# Patient Record
Sex: Female | Born: 2000 | Race: White | Hispanic: Yes | Smoking: Never smoker
Health system: Southern US, Community
[De-identification: ages and names within clinical notes are randomized; demographics above are authoritative.]

## PROBLEM LIST (undated history)

## (undated) HISTORY — PX: APPENDECTOMY: SHX54

---
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
## 2019-05-23 ENCOUNTER — Emergency Department (HOSPITAL_COMMUNITY)
Admission: EM | Admit: 2019-05-23 | Discharge: 2019-05-23 | Disposition: A | Discharge status: Home / Self Care | Payer: Self-pay | Attending: Emergency Medicine | Admitting: Emergency Medicine

## 2019-05-23 ENCOUNTER — Encounter (HOSPITAL_COMMUNITY): Payer: Self-pay | Admitting: Emergency Medicine

## 2019-05-23 ENCOUNTER — Other Ambulatory Visit: Payer: Self-pay

## 2019-05-23 DIAGNOSIS — Z20822 Contact with and (suspected) exposure to covid-19: Secondary | ICD-10-CM

## 2019-05-23 DIAGNOSIS — J029 Acute pharyngitis, unspecified: Secondary | ICD-10-CM | POA: Insufficient documentation

## 2019-05-23 DIAGNOSIS — Z20828 Contact with and (suspected) exposure to other viral communicable diseases: Secondary | ICD-10-CM | POA: Insufficient documentation

## 2019-05-23 DIAGNOSIS — B349 Viral infection, unspecified: Secondary | ICD-10-CM | POA: Insufficient documentation

## 2019-05-23 LAB — POC URINE PREG, ED: Preg Test, Ur: NEGATIVE

## 2019-05-23 LAB — GROUP A STREP BY PCR: Group A Strep by PCR: NOT DETECTED

## 2019-05-23 MED ORDER — ACETAMINOPHEN 500 MG PO TABS: 500.0000 mg | ORAL_TABLET | Freq: Four times a day (QID) | ORAL | 0 refills | Status: AC | PRN

## 2019-05-23 MED ORDER — LIDOCAINE VISCOUS HCL 2 % MT SOLN: 15.0000 mL | OROMUCOSAL | 0 refills | Status: AC | PRN

## 2019-05-23 MED ORDER — IBUPROFEN 800 MG PO TABS
800.0000 mg | ORAL_TABLET | Freq: Once | ORAL | Status: AC
Start: 2019-05-23 — End: 2019-05-23
  Administered 2019-05-23: 800 mg via ORAL
  Filled 2019-05-23: qty 1

## 2019-05-23 NOTE — ED Notes (Signed)
Patient verbalizes understanding of discharge instructions. Opportunity for questioning and answers were provided. Armband removed by staff, pt discharged from ED.  

## 2019-05-23 NOTE — ED Provider Notes (Signed)
[facility] EMERGENCY DEPARTMENT Provider Note   CSN: [id number] Arrival date & time: 05/23/19  1513     History   Chief Complaint Chief Complaint  Patient presents with  . Fever  . Sore Throat    HPI Debbie Glass is a 18 y.o. female.     The history is provided by the patient. No language interpreter was used.  Fever Sore Throat     18 year old female presenting complaining of sore throat.  Patient reports since last night she has had persistent sore throat.  Increasing pain with swallowing.  She also endorsed subjective fever, chills, symptom is moderate in severity.  She does not report any significant headache, ear pain, congestion, sneezing, coughing, chest pain or shortness of breath.  She is unsure if she is pregnant.  She did take some ibuprofen earlier today.  She works with a Scientist, research (physical sciences) at [employer].  She denies any recent sick contact or any contact with anyone with COVID-19.  No recent travel.  No past medical history on file.  Patient Active Problem List   Diagnosis Date Noted  . Appendicitis with peritonitis 08/27/2012  . Ileus, postoperative ([place]) 08/27/2012    Past Surgical History:  Procedure Laterality Date  . APPENDECTOMY    . LAPAROSCOPIC APPENDECTOMY  08/26/2012   Procedure: APPENDECTOMY LAPAROSCOPIC;  Surgeon: Jerilynn Mages. Gerald Stabs, MD;  Location: [facility];  Service: Pediatrics;  Laterality: N/A;     OB History   No obstetric history on file.      Home Medications    Prior to Admission medications   Medication Sig Start Date End Date Taking? Authorizing Provider  dextrose 5 % SOLN 50 mL with cefTRIAXone 1 G SOLR 2,000 mg Inject 2,000 mg into the vein daily. 09/01/12   Hess, Tamela Oddi, DO  ibuprofen (ADVIL,MOTRIN) 200 MG tablet Take 200 mg by mouth every 6 (six) hours as needed. For stomach pain.    [provider]  nystatin (MYCOSTATIN) 100000 UNIT/ML suspension Take 5 mLs (500,000 Units total) by mouth 4 (four) times  daily. 09/02/12   Isaac Bliss, MD  oxyCODONE (OXY IR/ROXICODONE) 5 MG immediate release tablet Take 5 mg by mouth every 4 (four) hours as needed. FOR PAIN 09/01/12   Nolon Rod, DO    Family History Family History  Problem Relation Age of Onset  . Hypertension Paternal Grandmother     Social History Social History   Tobacco Use  . Smoking status: Not on file  . Smokeless tobacco: Never Used  Substance Use Topics  . Alcohol use: No  . Drug use: No     Allergies   Patient has no known allergies.   Review of Systems Review of Systems  Constitutional: Positive for fever.  All other systems reviewed and are negative.    Physical Exam Updated Vital Signs BP 123/82   Pulse (!) 107   Temp 100.2 F (37.9 C) (Oral)   Resp 20   Ht 5' (1.524 m)   Wt 68 kg   SpO2 97%   BMI 29.29 kg/m   Physical Exam Vitals signs and nursing note reviewed.  Constitutional:      General: She is not in acute distress.    Appearance: She is well-developed.  HENT:     Head: Atraumatic.     Comments: Ears: TMs normal bilateral Nose: Normal nares Throat: Uvula midline bilateral tonsillar enlargement without exudates, no trismus    Mouth/Throat:     Mouth: Mucous membranes are  moist.  Eyes:     Conjunctiva/sclera: Conjunctivae normal.  Neck:     Musculoskeletal: Neck supple. No neck rigidity.  Cardiovascular:     Rate and Rhythm: Tachycardia present.     Pulses: Normal pulses.     Heart sounds: Normal heart sounds.  Pulmonary:     Effort: Pulmonary effort is normal.     Breath sounds: Normal breath sounds.  Abdominal:     General: Abdomen is flat.     Palpations: Abdomen is soft.     Tenderness: There is no abdominal tenderness.  Skin:    Findings: No rash.  Neurological:     Mental Status: She is alert and oriented to person, place, and time.      ED Treatments / Results  Labs (all labs ordered are listed, but only abnormal results are displayed) Labs Reviewed   GROUP A STREP BY PCR  NOVEL CORONAVIRUS, NAA (HOSPITAL ORDER, SEND-OUT TO REF LAB)  POC URINE PREG, ED    EKG    Radiology No results found.  Procedures Procedures (including critical care time)  Medications Ordered in ED Medications  ibuprofen (ADVIL) tablet 800 mg (800 mg Oral Given 05/23/19 1611)     Initial Impression / Assessment and Plan / ED Course  I have reviewed the triage vital signs and the nursing notes.  Pertinent labs & imaging results that were available during my care of the patient were reviewed by me and considered in my medical decision making (see chart for details).        BP 123/82   Pulse (!) 107   Temp 100.2 F (37.9 C) (Oral)   Resp 20   Ht 5' (1.524 m)   Wt 68 kg   SpO2 97%   BMI 29.29 kg/m    Final Clinical Impressions(s) / ED Diagnoses   Final diagnoses:  Viral pharyngitis  Suspected Covid-19 Virus Infection    ED Discharge Orders         Ordered    lidocaine (XYLOCAINE) 2 % solution  Every 4 hours PRN     05/23/19 1830    acetaminophen (TYLENOL) 500 MG tablet  Every 6 hours PRN     05/23/19 1830         3:36 PM Patient here with fever and sore throat.  Oral temp is 100.2.  She found to be tachycardic with a heart rate of 131, EKG shows evidence of sinus tach without concerning arrhythmia.  She does have tonsillar enlargement without exudates suspect pharyngitis causing his symptom.  In the setting of COVID-19, we will also obtain COVID-19 testing as well.  Patient otherwise well-appearing in no acute respiratory discomfort and no trouble tolerating her secretion.  6:32 PM Tachycardia improved with fluid and antipyretic.  strep test negative.  Pregnancy test is negative.  Patient is well-appearing.  She is able to tolerate p.o.  Will discharge home with symptomatic treatment.  Recommend social isolation and self quarantine until her test result comes back.  Return precaution discussed.  Debbie Glass was evaluated in  Emergency Department on 05/23/2019 for the symptoms described in the history of present illness. She was evaluated in the context of the global COVID-19 pandemic, which necessitated consideration that the patient might be at risk for infection with the SARS-CoV-2 virus that causes COVID-19. Institutional protocols and algorithms that pertain to the evaluation of patients at risk for COVID-19 are in a state of rapid change based on information released by regulatory bodies including the  CDC and federal and [employer] organizations. These policies and algorithms were followed during the patient's care in the ED.    Fayrene Helperran, Shakiyla Kook, PA-C 05/23/19 1833    Arby BarrettePfeiffer, Marcy, MD 05/25/19 [phone]

## 2019-05-23 NOTE — ED Triage Notes (Signed)
Patient c/o sore throat and fever onset of yesterday. Patient reports pain when swallowing. Denies any shortness of breath. Took ibuprofen at 3am last night.

## 2019-05-26 LAB — NOVEL CORONAVIRUS, NAA (HOSP ORDER, SEND-OUT TO REF LAB; TAT 18-24 HRS): SARS-CoV-2, NAA: NOT DETECTED

## 2021-02-07 ENCOUNTER — Other Ambulatory Visit: Payer: Self-pay

## 2021-02-07 ENCOUNTER — Ambulatory Visit (INDEPENDENT_AMBULATORY_CARE_PROVIDER_SITE_OTHER): Payer: 59 | Admitting: Registered Nurse

## 2021-02-07 ENCOUNTER — Encounter: Payer: Self-pay | Admitting: Registered Nurse

## 2021-02-07 VITALS — BP 123/85 | HR 82 | Temp 98.0°F | Resp 18 | Ht 60.0 in | Wt 165.0 lb

## 2021-02-07 DIAGNOSIS — Z0001 Encounter for general adult medical examination with abnormal findings: Secondary | ICD-10-CM | POA: Diagnosis not present

## 2021-02-07 DIAGNOSIS — Z7689 Persons encountering health services in other specified circumstances: Secondary | ICD-10-CM

## 2021-02-07 DIAGNOSIS — Z0000 Encounter for general adult medical examination without abnormal findings: Secondary | ICD-10-CM

## 2021-02-07 DIAGNOSIS — N939 Abnormal uterine and vaginal bleeding, unspecified: Secondary | ICD-10-CM

## 2021-02-07 DIAGNOSIS — Z3009 Encounter for other general counseling and advice on contraception: Secondary | ICD-10-CM

## 2021-02-07 LAB — CBC WITH DIFFERENTIAL
Basophils Absolute: 0 10*3/uL (ref 0.0–0.2)
Lymphocytes Absolute: 2.6 10*3/uL (ref 0.7–3.1)
MCHC: 34.7 g/dL (ref 31.5–35.7)
Monocytes: 6 %
Neutrophils Absolute: 4.5 10*3/uL (ref 1.4–7.0)
RDW: 12.4 % (ref 11.7–15.4)

## 2021-02-07 LAB — BASIC METABOLIC PANEL

## 2021-02-07 LAB — POCT URINE PREGNANCY: Preg Test, Ur: NEGATIVE

## 2021-02-07 LAB — HIV ANTIBODY (ROUTINE TESTING W REFLEX)

## 2021-02-07 LAB — THYROID PANEL WITH TSH

## 2021-02-07 LAB — RPR

## 2021-02-07 NOTE — Patient Instructions (Signed)
° ° ° °  If you have lab work done today you will be contacted with your lab results within the next 2 weeks.  If you have not heard from us then please contact us. The fastest way to get your results is to register for My Chart. ° ° °IF you received an x-ray today, you will receive an invoice from [facility]. Please contact [facility] at [phone] with questions or concerns regarding your invoice.  ° °IF you received labwork today, you will receive an invoice from [facility]. Please contact [facility] at [phone] with questions or concerns regarding your invoice.  ° °Our billing staff will not be able to assist you with questions regarding bills from these companies. ° °You will be contacted with the lab results as soon as they are available. The fastest way to get your results is to activate your My Chart account. Instructions are located on the last page of this paperwork. If you have not heard from us regarding the results in 2 weeks, please contact this office. °  ° ° ° °

## 2021-02-08 ENCOUNTER — Encounter: Payer: Self-pay | Admitting: Registered Nurse

## 2021-02-08 LAB — BASIC METABOLIC PANEL
BUN/Creatinine Ratio: 13 (ref 9–23)
BUN: 7 mg/dL (ref 6–20)
CO2: 20 mmol/L (ref 20–29)
Calcium: 9.4 mg/dL (ref 8.7–10.2)
Chloride: 102 mmol/L (ref 96–106)
Creatinine, Ser: 0.52 mg/dL — ABNORMAL LOW (ref 0.57–1.00)
Potassium: 4 mmol/L (ref 3.5–5.2)
eGFR: 135 mL/min/{1.73_m2} (ref >59)

## 2021-02-08 LAB — CBC WITH DIFFERENTIAL
Basos: 1 %
EOS (ABSOLUTE): 0.3 10*3/uL (ref 0.0–0.4)
Eos: 3 %
Hematocrit: 40.9 % (ref 34.0–46.6)
Hemoglobin: 14.2 g/dL (ref 11.1–15.9)
Immature Grans (Abs): 0 10*3/uL (ref 0.0–0.1)
Immature Granulocytes: 0 %
Lymphs: 33 %
MCH: 29.4 pg (ref 26.6–33.0)
MCV: 85 fL (ref 79–97)
Monocytes Absolute: 0.4 10*3/uL (ref 0.1–0.9)
Neutrophils: 57 %
RBC: 4.83 x10E6/uL (ref 3.77–5.28)
WBC: 7.8 10*3/uL (ref 3.4–10.8)

## 2021-02-08 LAB — THYROID PANEL WITH TSH
Free Thyroxine Index: 1.9 (ref 1.2–4.9)
T3 Uptake Ratio: 24 % (ref 24–39)
T4, Total: 8 ug/dL (ref 4.5–12.0)

## 2021-02-08 LAB — PROLACTIN: Prolactin: 14.7 ng/mL (ref 4.8–23.3)

## 2021-02-08 LAB — FSH/LH
FSH: 5.6 m[IU]/mL
LH: 10.8 m[IU]/mL

## 2021-02-08 MED ORDER — NORETHIN ACE-ETH ESTRAD-FE 1-20 MG-MCG PO TABS: 1.0000 | ORAL_TABLET | Freq: Every day | ORAL | 4 refills | Status: AC

## 2021-02-08 NOTE — Progress Notes (Signed)
New Patient Office Visit  Subjective:  Patient ID: Debbie Glass, female    DOB: 02-15-2001  Age: 20 y.o. MRN: [id number]  CC:  Chief Complaint  Patient presents with  . New Patient (Initial Visit)    Patient states she is here for and CPE and discuss having an irregular menstrual cycles     HPI Debbie Glass presents for CPE and AUB  AUB Ongoing for about 2 years Thinks it may be related to stress No clots or increased cramping No missed menses - still seems to have routine menses most months No STI exposure, in monogamous relationship Sexually active, female partner, no BCM, has not been on in past. No vaginal or urinary symptoms No systemic symptoms  Otherwise histories reviewed, updated as warranted.  History reviewed. No pertinent past medical history.  Past Surgical History:  Procedure Laterality Date  . APPENDECTOMY    . LAPAROSCOPIC APPENDECTOMY  08/26/2012   Procedure: APPENDECTOMY LAPAROSCOPIC;  Surgeon: Judie Petit. Leonia Corona, MD;  Location: [facility] OR;  Service: Pediatrics;  Laterality: N/A;    Family History  Problem Relation Age of Onset  . Hypertension Paternal Grandmother     Social History   Socioeconomic History  . Marital status: Significant Other    Spouse name: Not on file  . Number of children: 0  . Years of education: Not on file  . Highest education level: Not on file  Occupational History  . Not on file  Tobacco Use  . Smoking status: Never Smoker  . Smokeless tobacco: Never Used  Vaping Use  . Vaping Use: Never used  Substance and Sexual Activity  . Alcohol use: No  . Drug use: No  . Sexual activity: Yes  Other Topics Concern  . Not on file  Social History Narrative  . Not on file   Social Determinants of Health   Financial Resource Strain: Not on file  Food Insecurity: Not on file  Transportation Needs: Not on file  Physical Activity: Not on file  Stress: Not on file  Social Connections: Not on file  Intimate  Partner Violence: Not on file    ROS Review of Systems  Constitutional: Negative.   HENT: Negative.   Eyes: Negative.   Respiratory: Negative.   Cardiovascular: Negative.   Gastrointestinal: Negative.   Genitourinary: Negative.   Musculoskeletal: Negative.   Skin: Negative.   Neurological: Negative.   Psychiatric/Behavioral: Negative.   All other systems reviewed and are negative.   Objective:   Today's Vitals: BP 123/85   Pulse 82   Temp 98 F (36.7 C) (Temporal)   Resp 18   Ht 5' (1.524 m)   Wt 165 lb (74.8 kg)   SpO2 100%   BMI 32.22 kg/m   Physical Exam Vitals and nursing note reviewed.  Constitutional:      General: She is not in acute distress.    Appearance: Normal appearance. She is normal weight. She is not ill-appearing, toxic-appearing or diaphoretic.  HENT:     Head: Normocephalic and atraumatic.     Right Ear: Tympanic membrane, ear canal and external ear normal. There is no impacted cerumen.     Left Ear: Tympanic membrane, ear canal and external ear normal. There is no impacted cerumen.     Nose: Nose normal. No congestion or rhinorrhea.     Mouth/Throat:     Mouth: Mucous membranes are moist.     Pharynx: Oropharynx is clear. No oropharyngeal exudate or posterior oropharyngeal erythema.  Eyes:  General: No scleral icterus.       Right eye: No discharge.        Left eye: No discharge.     Extraocular Movements: Extraocular movements intact.     Conjunctiva/sclera: Conjunctivae normal.     Pupils: Pupils are equal, round, and reactive to light.  Neck:     Vascular: No carotid bruit.  Cardiovascular:     Rate and Rhythm: Normal rate and regular rhythm.     Pulses: Normal pulses.     Heart sounds: Normal heart sounds. No murmur heard. No friction rub. No gallop.   Pulmonary:     Effort: Pulmonary effort is normal. No respiratory distress.     Breath sounds: Normal breath sounds. No stridor. No wheezing, rhonchi or rales.  Chest:     Chest  wall: No tenderness.  Abdominal:     General: Abdomen is flat. Bowel sounds are normal. There is no distension.     Palpations: There is no mass.     Tenderness: There is no abdominal tenderness. There is no right CVA tenderness, left CVA tenderness, guarding or rebound.     Hernia: No hernia is present.  Musculoskeletal:        General: No swelling, tenderness, deformity or signs of injury. Normal range of motion.     Cervical back: Normal range of motion and neck supple. No rigidity or tenderness.     Right lower leg: No edema.     Left lower leg: No edema.  Lymphadenopathy:     Cervical: No cervical adenopathy.  Skin:    General: Skin is warm and dry.     Capillary Refill: Capillary refill takes less than 2 seconds.     Coloration: Skin is not jaundiced or pale.     Findings: No bruising, erythema, lesion or rash.  Neurological:     General: No focal deficit present.     Mental Status: She is alert and oriented to person, place, and time. Mental status is at baseline.     Cranial Nerves: No cranial nerve deficit.     Sensory: No sensory deficit.     Motor: No weakness.     Coordination: Coordination normal.     Gait: Gait normal.     Deep Tendon Reflexes: Reflexes normal.  Psychiatric:        Mood and Affect: Mood normal.        Behavior: Behavior normal.        Thought Content: Thought content normal.        Judgment: Judgment normal.     Assessment & Plan:   Problem List Items Addressed This Visit   None   Visit Diagnoses    Abnormal uterine bleeding (AUB)    -  Primary   Relevant Orders   CBC With Differential (Completed)   Basic Metabolic Panel (Completed)   HIV antibody (with reflex) (Completed)   RPR (Completed)   FSH/LH (Completed)   Prolactin (Completed)   Thyroid Panel With TSH (Completed)   POCT urine pregnancy (Completed)   Encounter to establish care       Annual physical exam          Outpatient Encounter Medications as of 02/07/2021  Medication  Sig  . acetaminophen (TYLENOL) 500 MG tablet Take 1 tablet (500 mg total) by mouth every 6 (six) hours as needed. (Patient not taking: Reported on 02/07/2021)  . dextrose 5 % SOLN 50 mL with cefTRIAXone 1 G SOLR 2,000 mg  Inject 2,000 mg into the vein daily. (Patient not taking: No sig reported)  . ibuprofen (ADVIL,MOTRIN) 200 MG tablet Take 400 mg by mouth every 6 (six) hours as needed for fever or mild pain. For stomach pain.  (Patient not taking: Reported on 02/07/2021)  . lidocaine (XYLOCAINE) 2 % solution Use as directed 15 mLs in the mouth or throat every 4 (four) hours as needed for mouth pain. (Patient not taking: Reported on 02/07/2021)  . Multiple Vitamins-Minerals (MULTIVITAMIN GUMMIES WOMENS PO) Take 2 tablets by mouth daily. (Patient not taking: Reported on 02/07/2021)  . nystatin (MYCOSTATIN) 100000 UNIT/ML suspension Take 5 mLs (500,000 Units total) by mouth 4 (four) times daily. (Patient not taking: No sig reported)   No facility-administered encounter medications on file as of 02/07/2021.    Follow-up: No follow-ups on file.   PLAN  Reassuring exam  Defer pap but discussed importance  Labs sent - unfortunately not enough urine for STI screen, will collect swab with pap  Discussed options for tx including controlling stress/anxiety and COCs. Pt opts for COCs. Discussed r/b/se and safe use. Discussed back up contraception and reasons to return to clinic  Patient encouraged to call clinic with any questions, comments, or concerns.  Janeece Agee, NP

## 2022-03-20 ENCOUNTER — Other Ambulatory Visit: Payer: Self-pay | Admitting: Registered Nurse

## 2022-03-20 DIAGNOSIS — N939 Abnormal uterine and vaginal bleeding, unspecified: Secondary | ICD-10-CM

## 2022-04-21 ENCOUNTER — Encounter (HOSPITAL_COMMUNITY): Payer: Self-pay | Admitting: Emergency Medicine

## 2022-04-21 ENCOUNTER — Other Ambulatory Visit: Payer: Self-pay

## 2022-04-21 ENCOUNTER — Emergency Department (HOSPITAL_COMMUNITY)
Admission: EM | Admit: 2022-04-21 | Discharge: 2022-04-21 | Disposition: A | Discharge status: Home / Self Care | Payer: 59 | Attending: Emergency Medicine | Admitting: Emergency Medicine

## 2022-04-21 DIAGNOSIS — R112 Nausea with vomiting, unspecified: Secondary | ICD-10-CM | POA: Diagnosis present

## 2022-04-21 DIAGNOSIS — R519 Headache, unspecified: Secondary | ICD-10-CM | POA: Diagnosis not present

## 2022-04-21 DIAGNOSIS — R000 Tachycardia, unspecified: Secondary | ICD-10-CM | POA: Insufficient documentation

## 2022-04-21 DIAGNOSIS — A084 Viral intestinal infection, unspecified: Secondary | ICD-10-CM | POA: Insufficient documentation

## 2022-04-21 DIAGNOSIS — R252 Cramp and spasm: Secondary | ICD-10-CM | POA: Insufficient documentation

## 2022-04-21 DIAGNOSIS — D72829 Elevated white blood cell count, unspecified: Secondary | ICD-10-CM | POA: Insufficient documentation

## 2022-04-21 LAB — I-STAT BETA HCG BLOOD, ED (MC, WL, AP ONLY): I-stat hCG, quantitative: <5 m[IU]/mL (ref <5)

## 2022-04-21 LAB — CBC
HCT: 42.7 % (ref 36.0–46.0)
Hemoglobin: 14.2 g/dL (ref 12.0–15.0)
MCH: 28.7 pg (ref 26.0–34.0)
MCHC: 33.3 g/dL (ref 30.0–36.0)
MCV: 86.4 fL (ref 80.0–100.0)
Platelets: 364 10*3/uL (ref 150–400)
RBC: 4.94 MIL/uL (ref 3.87–5.11)
RDW: 12.6 % (ref 11.5–15.5)
WBC: 16.9 10*3/uL — ABNORMAL HIGH (ref 4.0–10.5)
nRBC: 0 % (ref 0.0–0.2)

## 2022-04-21 LAB — URINALYSIS, ROUTINE W REFLEX MICROSCOPIC
Bilirubin Urine: NEGATIVE
Glucose, UA: NEGATIVE mg/dL
Hgb urine dipstick: NEGATIVE
Ketones, ur: NEGATIVE mg/dL
Leukocytes,Ua: NEGATIVE
Nitrite: NEGATIVE
Protein, ur: NEGATIVE mg/dL
Specific Gravity, Urine: 1.02 (ref 1.005–1.030)
pH: 6 (ref 5.0–8.0)

## 2022-04-21 LAB — COMPREHENSIVE METABOLIC PANEL
ALT: 26 U/L (ref 0–44)
AST: 23 U/L (ref 15–41)
Albumin: 4.2 g/dL (ref 3.5–5.0)
Alkaline Phosphatase: 62 U/L (ref 38–126)
Anion gap: 9 (ref 5–15)
BUN: 7 mg/dL (ref 6–20)
CO2: 23 mmol/L (ref 22–32)
Calcium: 9.1 mg/dL (ref 8.9–10.3)
Chloride: 104 mmol/L (ref 98–111)
Creatinine, Ser: 0.57 mg/dL (ref 0.44–1.00)
GFR, Estimated: >60 mL/min (ref >60)
Glucose, Bld: 116 mg/dL — ABNORMAL HIGH (ref 70–99)
Potassium: 3.4 mmol/L — ABNORMAL LOW (ref 3.5–5.1)
Sodium: 136 mmol/L (ref 135–145)
Total Bilirubin: 1.3 mg/dL — ABNORMAL HIGH (ref 0.3–1.2)
Total Protein: 7.7 g/dL (ref 6.5–8.1)

## 2022-04-21 LAB — LIPASE, BLOOD: Lipase: 23 U/L (ref 11–51)

## 2022-04-21 MED ORDER — POTASSIUM CHLORIDE 10 MEQ/100ML IV SOLN
10.0000 meq | Freq: Once | INTRAVENOUS | Status: AC
  Administered 2022-04-21: 10 meq via INTRAVENOUS
  Filled 2022-04-21: qty 100

## 2022-04-21 MED ORDER — SODIUM CHLORIDE 0.9 % IV BOLUS
1000.0000 mL | Freq: Once | INTRAVENOUS | Status: AC
Start: 2022-04-21 — End: 2022-04-21
  Administered 2022-04-21: 1000 mL via INTRAVENOUS

## 2022-04-21 MED ORDER — DICYCLOMINE HCL 20 MG PO TABS: 20.0000 mg | ORAL_TABLET | Freq: Two times a day (BID) | ORAL | 0 refills | Status: AC

## 2022-04-21 MED ORDER — ONDANSETRON 4 MG PO TBDP
4.0000 mg | ORAL_TABLET | Freq: Three times a day (TID) | ORAL | 0 refills | Status: AC | PRN
Start: 2022-04-21 — End: ?

## 2022-04-21 MED ORDER — METOCLOPRAMIDE HCL 5 MG/ML IJ SOLN
10.0000 mg | Freq: Once | INTRAMUSCULAR | Status: AC
  Administered 2022-04-21: 10 mg via INTRAVENOUS
  Filled 2022-04-21: qty 2

## 2022-04-21 NOTE — ED Triage Notes (Signed)
Pt reports diarrhea, vomiting, and headache since this morning.  C/o bilateral thumbs and index fingers being contracted and numb.

## 2022-04-21 NOTE — ED Notes (Signed)
Patient verbalizes understanding of d/c instructions. Opportunities for questions and answers were provided. Pt d/c from ED and ambulated to lobby.  

## 2022-04-21 NOTE — Discharge Instructions (Signed)
Please pick up medications and take as prescribed. Continue drinking plenty of fluids to stay hydrated and rest as much as possible.   It is recommended that you follow a bland diet over the next couple of days to prevent worsening of GI symptoms. Attached is more information on same.   Follow up with your PCP for further eval  Return to the ED for any new/worsening symptoms

## 2022-04-21 NOTE — ED Notes (Signed)
PO fluid offered.

## 2022-04-21 NOTE — ED Provider Notes (Signed)
[facility] EMERGENCY DEPARTMENT Provider Note   CSN: [id number] Arrival date & time: 04/21/22  1348     History  Chief Complaint  Patient presents with   Vomiting    Debbie Glass is a 21 y.o. female who presents to the ED today with complaint of nausea, NBNB emesis, and diarrhea that began this morning. Pt also complains of a mild headache and her bilateral hands cramping/contracting. She reports recent sick contact with her brother who had a stomach bug. She took some of his Zofran earlier today - still complains of nausea however has not vomited since the second dose around noon today. She also took Imodium which has slowed down the diarrhea. She denies any abdominal pain. Does report mild chills however denies fevers. PSHx includes appendectomy at the age of 55. LNMP mid April.   The history is provided by the patient and medical records.      Home Medications Prior to Admission medications   Medication Sig Start Date End Date Taking? Authorizing Provider  dicyclomine (BENTYL) 20 MG tablet Take 1 tablet (20 mg total) by mouth 2 (two) times daily. 04/21/22  Yes Neal Oshea, PA-C  ondansetron (ZOFRAN-ODT) 4 MG disintegrating tablet Take 1 tablet (4 mg total) by mouth every 8 (eight) hours as needed for nausea or vomiting. 04/21/22  Yes Hyman Hopes, Shamica Moree, PA-C  acetaminophen (TYLENOL) 500 MG tablet Take 1 tablet (500 mg total) by mouth every 6 (six) hours as needed. Patient not taking: Reported on 02/07/2021 05/23/19   Fayrene Helper, PA-C  dextrose 5 % SOLN 50 mL with cefTRIAXone 1 G SOLR 2,000 mg Inject 2,000 mg into the vein daily. Patient not taking: No sig reported 09/01/12   Gildardo Cranker R, DO  ibuprofen (ADVIL,MOTRIN) 200 MG tablet Take 400 mg by mouth every 6 (six) hours as needed for fever or mild pain. For stomach pain.  Patient not taking: Reported on 02/07/2021    [provider]  lidocaine (XYLOCAINE) 2 % solution Use as directed 15 mLs in the  mouth or throat every 4 (four) hours as needed for mouth pain. Patient not taking: Reported on 02/07/2021 05/23/19   Fayrene Helper, PA-C  Multiple Vitamins-Minerals (MULTIVITAMIN GUMMIES WOMENS PO) Take 2 tablets by mouth daily. Patient not taking: Reported on 02/07/2021    [provider]  norethindrone-ethinyl estradiol (JUNEL FE 1/20) 1-20 MG-MCG tablet Take 1 tablet by mouth daily. 02/08/21   Janeece Agee, NP  nystatin (MYCOSTATIN) 100000 UNIT/ML suspension Take 5 mLs (500,000 Units total) by mouth 4 (four) times daily. Patient not taking: No sig reported 09/02/12   Marcellina Millin, MD      Allergies    Patient has no known allergies.    Review of Systems   Review of Systems  Constitutional:  Positive for chills. Negative for fever.  HENT:  Negative for congestion.   Respiratory:  Negative for cough and shortness of breath.   Gastrointestinal:  Positive for diarrhea, nausea and vomiting. Negative for abdominal pain.  Genitourinary:  Negative for dysuria, flank pain, frequency, pelvic pain and vaginal discharge.  Neurological:  Positive for headaches.  All other systems reviewed and are negative.  Physical Exam Updated Vital Signs BP (!) 120/93 (BP Location: Right Arm)   Pulse (!) 103   Temp 98.6 F (37 C) (Oral)   Resp 18   LMP 04/12/2022   SpO2 100%  Physical Exam Vitals and nursing note reviewed.  Constitutional:      Appearance: She is  not ill-appearing or diaphoretic.  HENT:     Head: Normocephalic and atraumatic.     Mouth/Throat:     Mouth: Mucous membranes are dry.  Eyes:     Conjunctiva/sclera: Conjunctivae normal.  Cardiovascular:     Rate and Rhythm: Regular rhythm. Tachycardia present.     Pulses: Normal pulses.  Pulmonary:     Effort: Pulmonary effort is normal.     Breath sounds: Normal breath sounds. No wheezing, rhonchi or rales.  Abdominal:     Palpations: Abdomen is soft.     Tenderness: There is no abdominal tenderness. There is no right CVA  tenderness, left CVA tenderness, guarding or rebound.  Musculoskeletal:     Cervical back: Neck supple.  Skin:    General: Skin is warm and dry.  Neurological:     Mental Status: She is alert.    ED Results / Procedures / Treatments   Labs (all labs ordered are listed, but only abnormal results are displayed) Labs Reviewed  COMPREHENSIVE METABOLIC PANEL - Abnormal; Notable for the following components:      Result Value   Potassium 3.4 (*)    Glucose, Bld 116 (*)    Total Bilirubin 1.3 (*)    All other components within normal limits  CBC - Abnormal; Notable for the following components:   WBC 16.9 (*)    All other components within normal limits  LIPASE, BLOOD  URINALYSIS, ROUTINE W REFLEX MICROSCOPIC  I-STAT BETA HCG BLOOD, ED ([facility], [facility], [facility] ONLY)    EKG None  Radiology No results found.  Procedures Procedures    Medications Ordered in ED Medications  sodium chloride 0.9 % bolus 1,000 mL (0 mLs Intravenous Stopped 04/21/22 1822)  metoCLOPramide (REGLAN) injection 10 mg (10 mg Intravenous Given 04/21/22 1626)  potassium chloride 10 mEq in 100 mL IVPB (0 mEq Intravenous Stopped 04/21/22 1727)    ED Course/ Medical Decision Making/ A&P                           Medical Decision Making 21 year old female who presents to the ED today with GI symptoms including nausea, vomiting, diarrhea after recent sick contact with family member.  On arrival to the ED today patient is afebrile, nontachypneic.  Mildly tachycardic at 103.  Remainder vitals are unremarkable.  On exam she has no abdominal tenderness palpation and denies specific abdominal pain.  Past surgical history includes appendectomy.  She is noted to have dry mucous membranes.  She does mention that her hands are cramping however has full range of motion of her hands on exam.  Neurovascularly intact.  Question dehydration versus electrolyte derangement.  We will plan for labs, fluids, antiemetics and reevaluation.  Given  abdomen is soft and nontender without specific abdominal pain and recent sick contact with family member who had GI bug I have very low suspicion for acute surgical abdomen at this time.  Do not feel patient requires CT scan.   CBC with leukocytosis of 16,900 without left shift.  Suspect secondary to both dehydration versus viral infection.  Given very low suspicion for acute surgical abdomen at this time despite leukocytosis as patient is denying any abdominal pain.   CMP with a potassium of 3.4.  No other electrolyte abnormalities.  T. bili slightly elevated at 1.3 however remainder of LFTs unremarkable.  Lipase within normal limits at 23.  Beta-hCG negative.  On reevaluation pt resting comfortably and reports improvement in symptoms.  No longer experiencing cramping to hands. Suspect s/2 dehydration. Pt has been able to tolerate fluids here without difficulty. Will plan to discharge home at this time. Pt encouraged to continue staying hydrated and to take Rx zofran and bentyl PRN. She is encouraged to follow up with her PCP for further eval and to return to the ED for any new/worsening symptoms.   Problems Addressed: Viral gastroenteritis: acute illness or injury  Amount and/or Complexity of Data Reviewed Labs: ordered. Decision-making details documented in ED Course.  Risk Prescription drug management.          Final Clinical Impression(s) / ED Diagnoses Final diagnoses:  Viral gastroenteritis    Rx / DC Orders ED Discharge Orders          Ordered    dicyclomine (BENTYL) 20 MG tablet  2 times daily        04/21/22 1847    ondansetron (ZOFRAN-ODT) 4 MG disintegrating tablet  Every 8 hours PRN        04/21/22 1847             Discharge Instructions      Please pick up medications and take as prescribed. Continue drinking plenty of fluids to stay hydrated and rest as much as possible.   It is recommended that you follow a bland diet over the next couple of days  to prevent worsening of GI symptoms. Attached is more information on same.   Follow up with your PCP for further eval  Return to the ED for any new/worsening symptoms       Tanda Rockers, Cordelia Poche 04/21/22 1905    Terald Sleeper, MD 04/21/22 2113

## 2023-09-04 ENCOUNTER — Emergency Department (HOSPITAL_COMMUNITY)
Admission: EM | Admit: 2023-09-04 | Discharge: 2023-09-04 | Disposition: A | Discharge status: Home / Self Care | Payer: 59 | Attending: Emergency Medicine | Admitting: Emergency Medicine

## 2023-09-04 ENCOUNTER — Emergency Department (HOSPITAL_COMMUNITY): Payer: 59

## 2023-09-04 ENCOUNTER — Other Ambulatory Visit: Payer: Self-pay

## 2023-09-04 ENCOUNTER — Encounter (HOSPITAL_COMMUNITY): Payer: Self-pay

## 2023-09-04 DIAGNOSIS — S199XXA Unspecified injury of neck, initial encounter: Secondary | ICD-10-CM | POA: Diagnosis present

## 2023-09-04 DIAGNOSIS — M25511 Pain in right shoulder: Secondary | ICD-10-CM | POA: Insufficient documentation

## 2023-09-04 DIAGNOSIS — Y92410 Unspecified street and highway as the place of occurrence of the external cause: Secondary | ICD-10-CM | POA: Diagnosis not present

## 2023-09-04 DIAGNOSIS — S161XXA Strain of muscle, fascia and tendon at neck level, initial encounter: Secondary | ICD-10-CM | POA: Insufficient documentation

## 2023-09-04 MED ORDER — CYCLOBENZAPRINE HCL 5 MG PO TABS: 10.0000 mg | ORAL_TABLET | Freq: Two times a day (BID) | ORAL | 0 refills | Status: AC | PRN

## 2023-09-04 MED ORDER — IBUPROFEN 400 MG PO TABS
600.0000 mg | ORAL_TABLET | Freq: Once | ORAL | Status: AC
  Administered 2023-09-04: 600 mg via ORAL
  Filled 2023-09-04: qty 1
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
## 2023-09-04 NOTE — ED Notes (Signed)
Dr Criss Alvine removed pt's c-collar
